# Patient Record
Sex: Female | Born: 1965 | Race: White | Hispanic: No | Marital: Married | Smoking: Current every day smoker
Health system: Southern US, Community
[De-identification: ages and names within clinical notes are randomized; demographics above are authoritative.]

## PROBLEM LIST (undated history)

## (undated) DIAGNOSIS — C801 Malignant (primary) neoplasm, unspecified: Secondary | ICD-10-CM

## (undated) HISTORY — PX: CHOLECYSTECTOMY: SHX55

---

## 2018-02-20 ENCOUNTER — Emergency Department (HOSPITAL_COMMUNITY): Payer: Commercial Managed Care - PPO

## 2018-02-20 ENCOUNTER — Other Ambulatory Visit: Payer: Self-pay

## 2018-02-20 ENCOUNTER — Emergency Department (HOSPITAL_COMMUNITY)
Admission: EM | Admit: 2018-02-20 | Discharge: 2018-02-20 | Disposition: A | Discharge status: Home / Self Care | Payer: Commercial Managed Care - PPO | Attending: Emergency Medicine | Admitting: Emergency Medicine

## 2018-02-20 ENCOUNTER — Encounter (HOSPITAL_COMMUNITY): Payer: Self-pay | Admitting: *Deleted

## 2018-02-20 DIAGNOSIS — F17200 Nicotine dependence, unspecified, uncomplicated: Secondary | ICD-10-CM | POA: Insufficient documentation

## 2018-02-20 DIAGNOSIS — R918 Other nonspecific abnormal finding of lung field: Secondary | ICD-10-CM

## 2018-02-20 DIAGNOSIS — R079 Chest pain, unspecified: Secondary | ICD-10-CM | POA: Diagnosis present

## 2018-02-20 DIAGNOSIS — Z7982 Long term (current) use of aspirin: Secondary | ICD-10-CM | POA: Diagnosis not present

## 2018-02-20 HISTORY — DX: Malignant (primary) neoplasm, unspecified: C80.1

## 2018-02-20 LAB — D-DIMER, QUANTITATIVE: D-Dimer, Quant: 1.2 ug/mL-FEU — ABNORMAL HIGH (ref 0.00–0.50)

## 2018-02-20 LAB — CBC
HEMATOCRIT: 41.7 % (ref 36.0–46.0)
Hemoglobin: 14.4 g/dL (ref 12.0–15.0)
MCH: 30.6 pg (ref 26.0–34.0)
MCHC: 34.5 g/dL (ref 30.0–36.0)
MCV: 88.7 fL (ref 78.0–100.0)
Platelets: 322 10*3/uL (ref 150–400)
RBC: 4.7 MIL/uL (ref 3.87–5.11)
RDW: 12.8 % (ref 11.5–15.5)
WBC: 8.3 10*3/uL (ref 4.0–10.5)

## 2018-02-20 LAB — BASIC METABOLIC PANEL
Anion gap: 8 (ref 5–15)
BUN: 10 mg/dL (ref 6–20)
CO2: 19 mmol/L — ABNORMAL LOW (ref 22–32)
Calcium: 8.6 mg/dL — ABNORMAL LOW (ref 8.9–10.3)
Chloride: 109 mmol/L (ref 98–111)
Creatinine, Ser: 0.64 mg/dL (ref 0.44–1.00)
GFR calc Af Amer: >60 mL/min (ref >60)
Glucose, Bld: 103 mg/dL — ABNORMAL HIGH (ref 70–99)
POTASSIUM: 3.4 mmol/L — AB (ref 3.5–5.1)
SODIUM: 136 mmol/L (ref 135–145)

## 2018-02-20 LAB — TROPONIN I

## 2018-02-20 MED ORDER — IOPAMIDOL (ISOVUE-370) INJECTION 76%
100.0000 mL | Freq: Once | INTRAVENOUS | Status: AC | PRN
  Administered 2018-02-20: 100 mL via INTRAVENOUS

## 2018-02-20 NOTE — ED Provider Notes (Signed)
[facility] EMERGENCY DEPARTMENT Provider Note   CSN: [id number] Arrival date & time: 02/20/18  1933     History   Chief Complaint Chief Complaint  Patient presents with  . Chest Pain    HPI Angel Booker is a 52 y.o. female.  Intermittent anterior chest pain for 1 week with episodes lasting anywhere from minutes to hours, described as a tightness.  She states minimal dyspnea, nausea, diaphoresis.  Risk factors include smoking (2 packs/day), low HDL.  No history of diabetes, hypertension, family history.  Symptoms are not associated with any activity.  Nothing makes symptoms better or worse.     Past Medical History:  Diagnosis Date  . Cancer ([place])    lip cancer    There are no active problems to display for this patient.   Past Surgical History:  Procedure Laterality Date  . CHOLECYSTECTOMY       OB History   None      Home Medications    Prior to Admission medications   Medication Sig Start Date End Date Taking? Authorizing Provider  acetaminophen (TYLENOL) 500 MG tablet Take 500 mg by mouth every 6 (six) hours as needed for mild pain or headache.   Yes [provider]  aspirin 325 MG tablet Take 325 mg by mouth daily.   Yes [provider]    Family History No family history on file.  Social History Social History   Tobacco Use  . Smoking status: Current Every Day Smoker  . Smokeless tobacco: Never Used  Substance Use Topics  . Alcohol use: Never    Frequency: Never  . Drug use: Never     Allergies   Codeine and Penicillins   Review of Systems Review of Systems  All other systems reviewed and are negative.    Physical Exam Updated Vital Signs BP 97/62   Pulse 71   Temp 99 F (37.2 C) (Oral) Comment: Simultaneous filing. User may not have seen previous data. Comment (Src): Simultaneous filing. User may not have seen previous data.  Resp 20   Ht 5\' 2"  (1.575 m)   Wt 68 kg   SpO2 93%   BMI 27.44 kg/m    Physical Exam  Constitutional: She is oriented to person, place, and time. She appears well-developed and well-nourished.  HENT:  Head: Normocephalic and atraumatic.  Eyes: Conjunctivae are normal.  Neck: Neck supple.  Cardiovascular: Normal rate and regular rhythm.  Pulmonary/Chest: Effort normal and breath sounds normal.  Abdominal: Soft. Bowel sounds are normal.  Musculoskeletal: Normal range of motion.  Neurological: She is alert and oriented to person, place, and time.  Skin: Skin is warm and dry.  Psychiatric: She has a normal mood and affect. Her behavior is normal.  Nursing note and vitals reviewed.    ED Treatments / Results  Labs (all labs ordered are listed, but only abnormal results are displayed) Labs Reviewed  BASIC METABOLIC PANEL - Abnormal; Notable for the following components:      Result Value   Potassium 3.4 (*)    CO2 19 (*)    Glucose, Bld 103 (*)    Calcium 8.6 (*)    All other components within normal limits  D-DIMER, QUANTITATIVE (NOT AT [facility]) - Abnormal; Notable for the following components:   D-Dimer, Quant 1.20 (*)    All other components within normal limits  CBC  TROPONIN I    EKG EKG Interpretation  Date/Time:  Sunday February 20 2018 19:42:31 EDT Ventricular  Rate:  79 PR Interval:    QRS Duration: 77 QT Interval:  389 QTC Calculation: 446 R Axis:   70 Text Interpretation:  Sinus rhythm Baseline wander in lead(s) II III aVL aVF V3 Confirmed by Nat Christen [phone]) on 02/20/2018 9:04:11 PM   Radiology Dg Chest 2 View  Result Date: 02/20/2018 CLINICAL DATA:  Acute chest pain today. EXAM: CHEST - 2 VIEW COMPARISON:  None. FINDINGS: The cardiomediastinal silhouette is unremarkable. There is no evidence of focal airspace disease, pulmonary edema, suspicious pulmonary nodule/mass, pleural effusion, or pneumothorax. No acute bony abnormalities are identified. IMPRESSION: No active cardiopulmonary disease. Electronically Signed   By: Margarette Canada M.D.   On: 02/20/2018 20:33   Ct Angio Chest Pe W And/or Wo Contrast  Result Date: 02/20/2018 CLINICAL DATA:  PE suspected, intermediate prob, positive D-dimer. Chest pain. Shortness of breath. EXAM: CT ANGIOGRAPHY CHEST WITH CONTRAST TECHNIQUE: Multidetector CT imaging of the chest was performed using the standard protocol during bolus administration of intravenous contrast. Multiplanar CT image reconstructions and MIPs were obtained to evaluate the vascular anatomy. CONTRAST:  132mL ISOVUE-370 IOPAMIDOL (ISOVUE-370) INJECTION 76% COMPARISON:  Radiograph earlier this day. FINDINGS: Cardiovascular: There are no filling defects within the pulmonary arteries to suggest pulmonary embolus. The thoracic aorta is normal in caliber without dissection. Mention of branching pattern from the aortic arch. The heart is normal in size. No pericardial effusion. Mediastinum/Nodes: No enlarged mediastinal or hilar lymph nodes. Minimal ill-defined soft tissue density in the anterior mediastinum without well-defined mass. Small to moderate hiatal hernia. Stomach as well as the hiatal hernia contains ingested contents. No visualized thyroid nodule. Lungs/Pleura: Mild apical prominent emphysema. No consolidation, pulmonary edema or pleural fluid. Tiny right upper lobe subpleural nodules images 32 and 38 series 6. Linear opacity in the anterior right upper lobe image 62 series 3 may be scarring or atelectasis, does not have a rounded or nodular appearance. Trachea mainstem bronchi are patent. Upper Abdomen: Small to moderate hiatal hernia. Stomach as well as hiatal hernia and distended with ingested contents. Post cholecystectomy. Musculoskeletal: Scattered degenerative change and Schmorl's nodes in the spine. There are no acute or suspicious osseous abnormalities. Review of the MIP images confirms the above findings. IMPRESSION: 1. No pulmonary embolus or acute intrathoracic abnormality. 2. Mild emphysema. 3. Tiny right upper  lobe subpleural nodules are nonspecific, there is an additional linear opacity in the anterior right upper lobe that is likely scarring or atelectasis, but developing pulmonary nodule is also considered in the setting of emphysema. Non-contrast chest CT can be considered in 12 months if patient is high-risk. This recommendation follows the consensus statement: Guidelines for Management of Incidental Pulmonary Nodules Detected on CT Images: From the Fleischner Society 2017; Radiology 2017; 284:228-243. 4. Small to moderate hiatal hernia. There is ingested material within the stomach as well as hiatal hernia, which can be seen with reflux. Emphysema (ICD10-J43.9). Electronically Signed   By: Keith Rake M.D.   On: 02/20/2018 21:48    Procedures Procedures (including critical care time)  Medications Ordered in ED Medications  iopamidol (ISOVUE-370) 76 % injection 100 mL (100 mLs Intravenous Contrast Given 02/20/18 2049)     Initial Impression / Assessment and Plan / ED Course  I have reviewed the triage vital signs and the nursing notes.  Pertinent labs & imaging results that were available during my care of the patient were reviewed by me and considered in my medical decision making (see chart for details).  Presents with intermittent chest pain for 1 week.  EKG, troponin, screening labs all negative.  D-dimer slightly elevated.  CT angiogram reveals tiny right upper lobe subpleural nodules which are nonspecific.  There is an additional linear opacity in the anterior right upper lobe but this is likely scaring or atelectasis.  Will recommend a noncontrast CT in 12 months.  This was discussed with the patient and her husband.  We will follow-up with cardiology also.  Final Clinical Impressions(s) / ED Diagnoses   Final diagnoses:  Chest pain, unspecified type  Pulmonary nodules    ED Discharge Orders    None       Nat Christen, MD 02/20/18 2155

## 2018-02-20 NOTE — ED Triage Notes (Signed)
Pt c/o mid sternal chest pain that radiates to the back area that started a week ago becoming worse last night with sob, n/v,

## 2018-02-20 NOTE — Discharge Instructions (Addendum)
Tests showed no evidence of a heart attack.  You do have some nodules in your right upper lung.  Recommend CT scan in 12 months.  Suggest follow-up with cardiologist.  Phone number given.  Stop smoking.

## 2019-09-11 IMAGING — CT CT ANGIO CHEST
2 of 6 series · 17 of 46 positions shown · IV contrast (Isovue)
Comparison: Radiograph earlier this day.

CLINICAL DATA: PE suspected, intermediate prob, positive D-dimer.
Chest pain. Shortness of breath.

EXAM:
CT ANGIOGRAPHY CHEST WITH CONTRAST
TECHNIQUE: Multidetector CT imaging of the chest was performed using the
standard protocol during bolus administration of intravenous
contrast. Multiplanar CT image reconstructions and MIPs were
obtained to evaluate the vascular anatomy.
CONTRAST:  100mL XC70A5-3CK IOPAMIDOL (XC70A5-3CK) INJECTION 76%

[Series 5: thins · axial · 0.64mm/px · z∈[+1473,+1730]mm · 14 of 283 slices shown]
[im 13/283  lung]
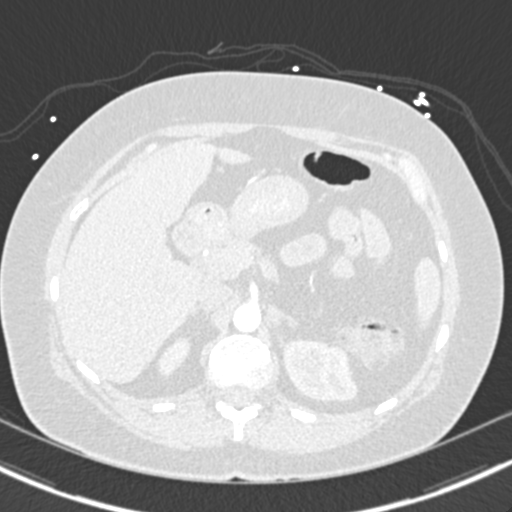
[im 37/283  soft-tissue]
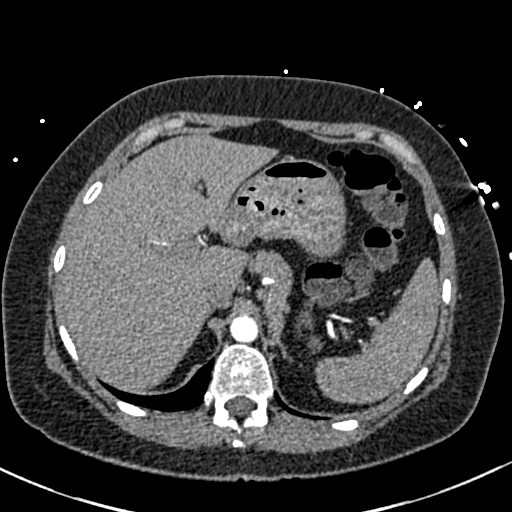
[im 50/283  lung]
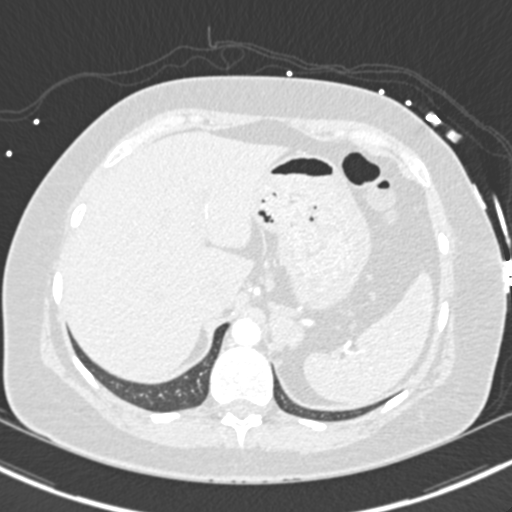
[im 74/283  soft-tissue]
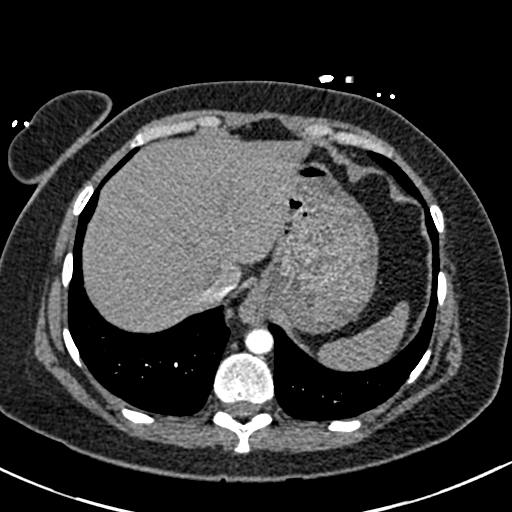
[im 99/283  lung]
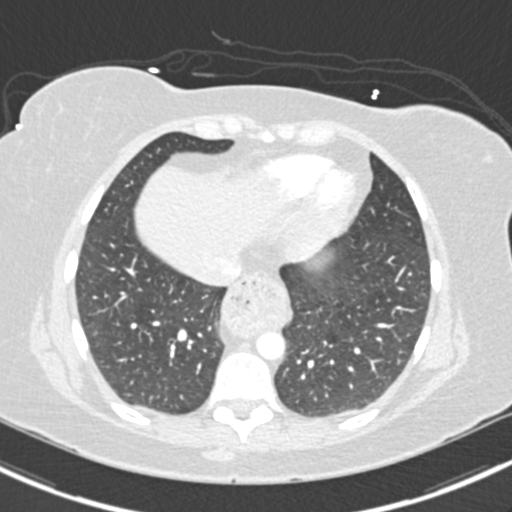
[im 111/283  soft-tissue]
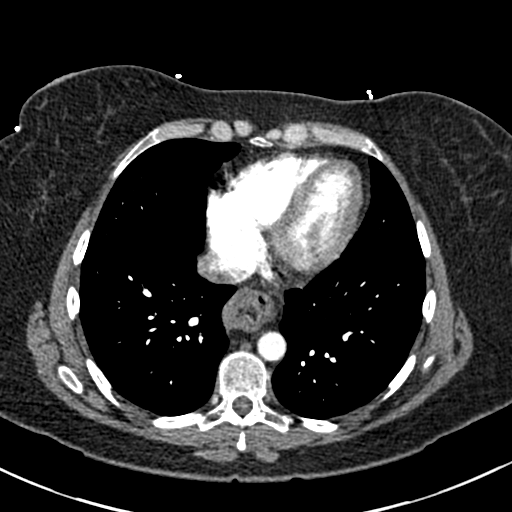
[im 135/283  lung]
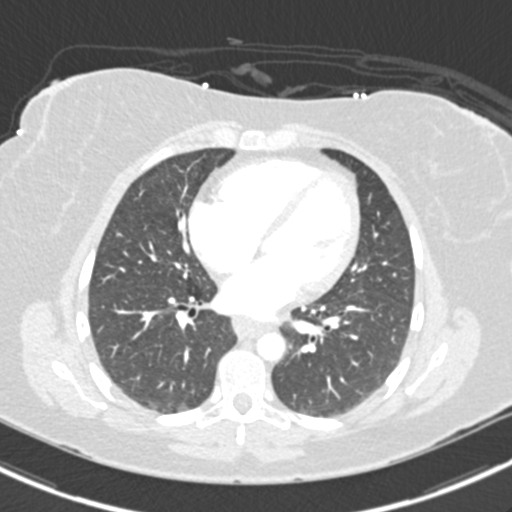
[im 148/283  soft-tissue]
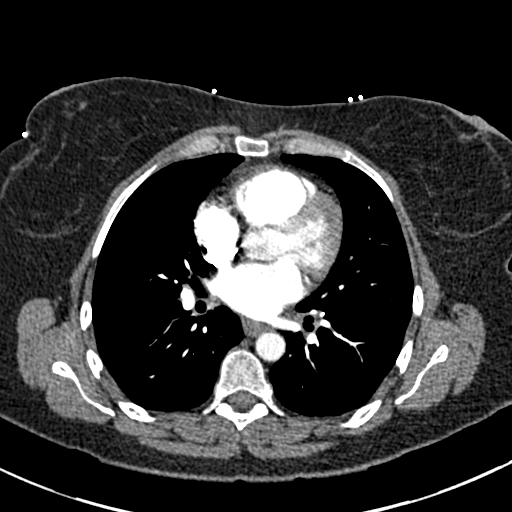
[im 172/283  lung]
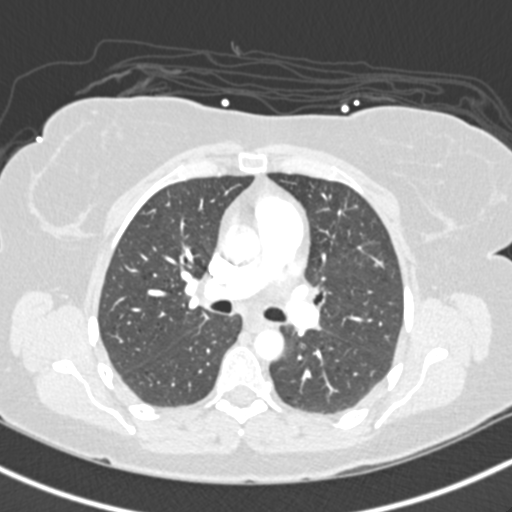
[im 184/283  soft-tissue]
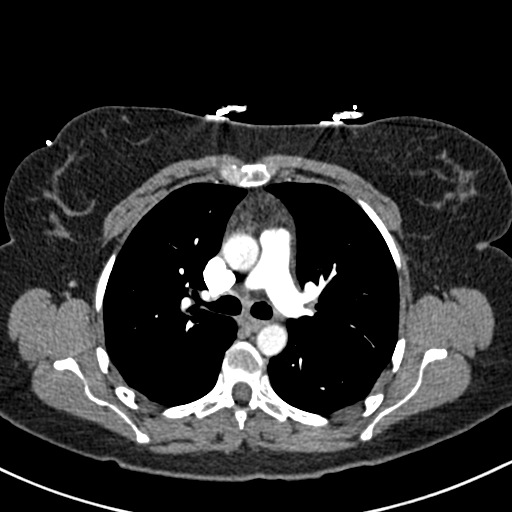
[im 209/283  lung]
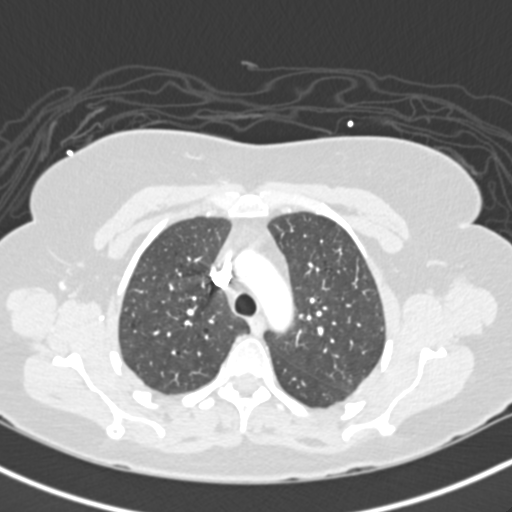
[im 233/283  soft-tissue]
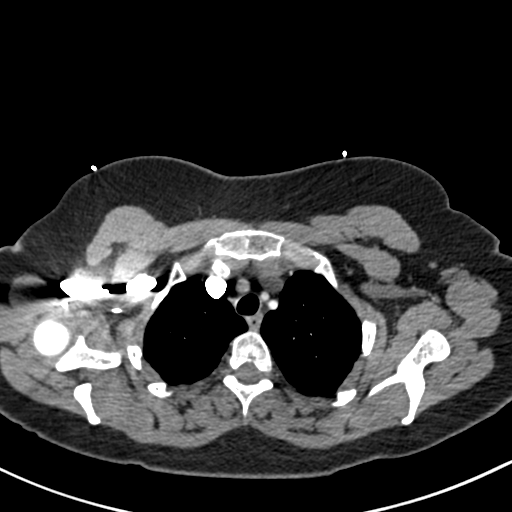
[im 246/283  lung]
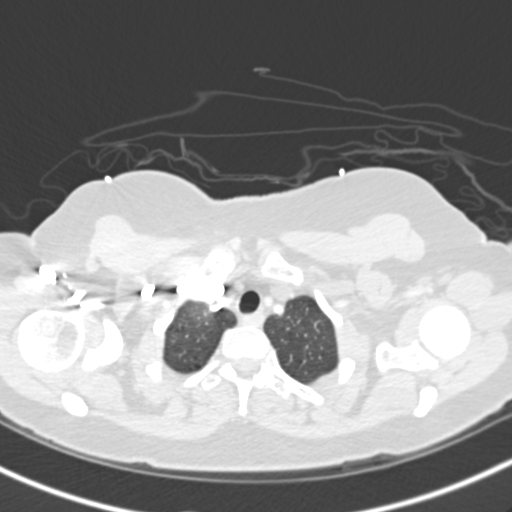
[im 270/283  soft-tissue]
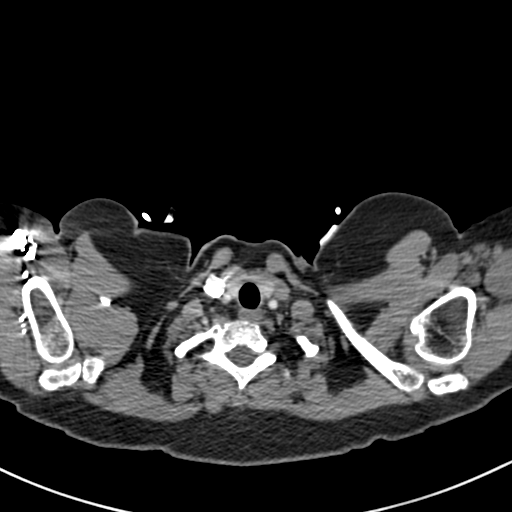

[Series 7: coronal mpr · coronal · 0.59mm/px · 3 of 150 slices shown]
[im 38/150  soft-tissue]
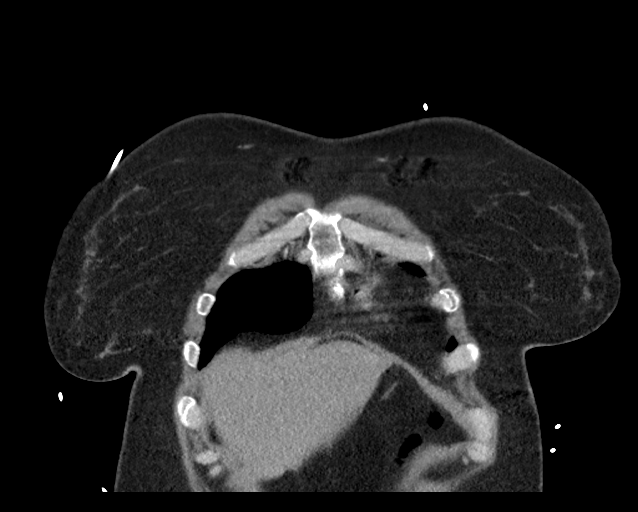
[im 75/150  soft-tissue]
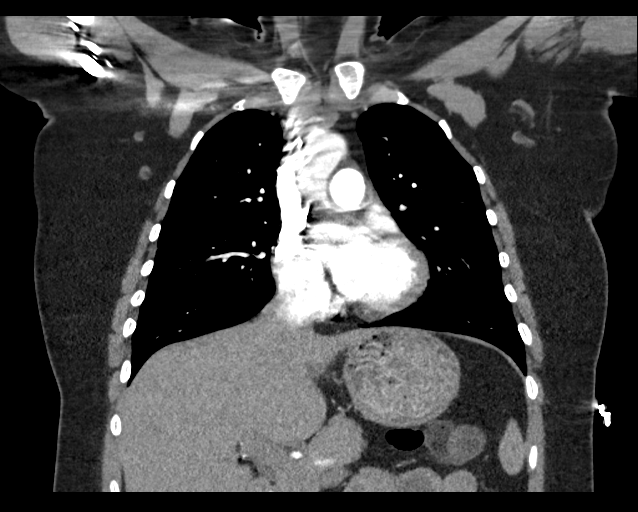
[im 112/150  soft-tissue]
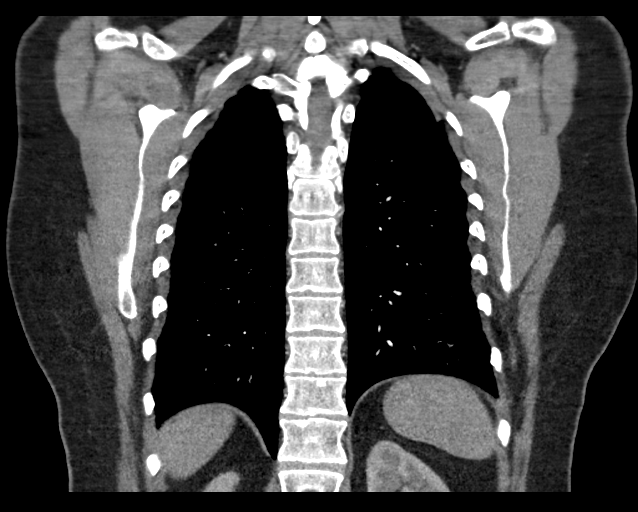

[17 of 46 positions shown; findings below may reference images not displayed]

FINDINGS: Cardiovascular: There are no filling defects within the pulmonary
arteries to suggest pulmonary embolus. The thoracic aorta is normal
in caliber without dissection. Mention of branching pattern from the
aortic arch. The heart is normal in size. No pericardial effusion.

Mediastinum/Nodes: No enlarged mediastinal or hilar lymph nodes.
Minimal ill-defined soft tissue density in the anterior mediastinum
without well-defined mass. Small to moderate hiatal hernia. Stomach
as well as the hiatal hernia contains ingested contents. No
visualized thyroid nodule.

Lungs/Pleura: Mild apical prominent emphysema. No consolidation,
pulmonary edema or pleural fluid. Tiny right upper lobe subpleural
nodules images 32 and 38 series 6. Linear opacity in the anterior
right upper lobe image 62 series [DATE] be scarring or atelectasis,
does not have a rounded or nodular appearance. Trachea mainstem
bronchi are patent.

Upper Abdomen: Small to moderate hiatal hernia. Stomach as well as
hiatal hernia and distended with ingested contents. Post
cholecystectomy.

Musculoskeletal: Scattered degenerative change and Schmorl's nodes
in the spine. There are no acute or suspicious osseous
abnormalities.

Review of the MIP images confirms the above findings.
IMPRESSION: 1. No pulmonary embolus or acute intrathoracic abnormality.
2. Mild emphysema.
3. Tiny right upper lobe subpleural nodules are nonspecific, there
is an additional linear opacity in the anterior right upper lobe
that is likely scarring or atelectasis, but developing pulmonary
nodule is also considered in the setting of emphysema. Non-contrast
chest CT can be considered in 12 months if patient is high-risk.
This recommendation follows the consensus statement: Guidelines for
Management of Incidental Pulmonary Nodules Detected on CT Images:
4. Small to moderate hiatal hernia. There is ingested material
within the stomach as well as hiatal hernia, which can be seen with
reflux.

Emphysema ([id number]).

## 2019-09-11 IMAGING — DX DG CHEST 2V
2 series · 2 of 2 positions shown · non-contrast
Comparison: None.

CLINICAL DATA: Acute chest pain today.

EXAM:
CHEST - 2 VIEW

[chest pa]
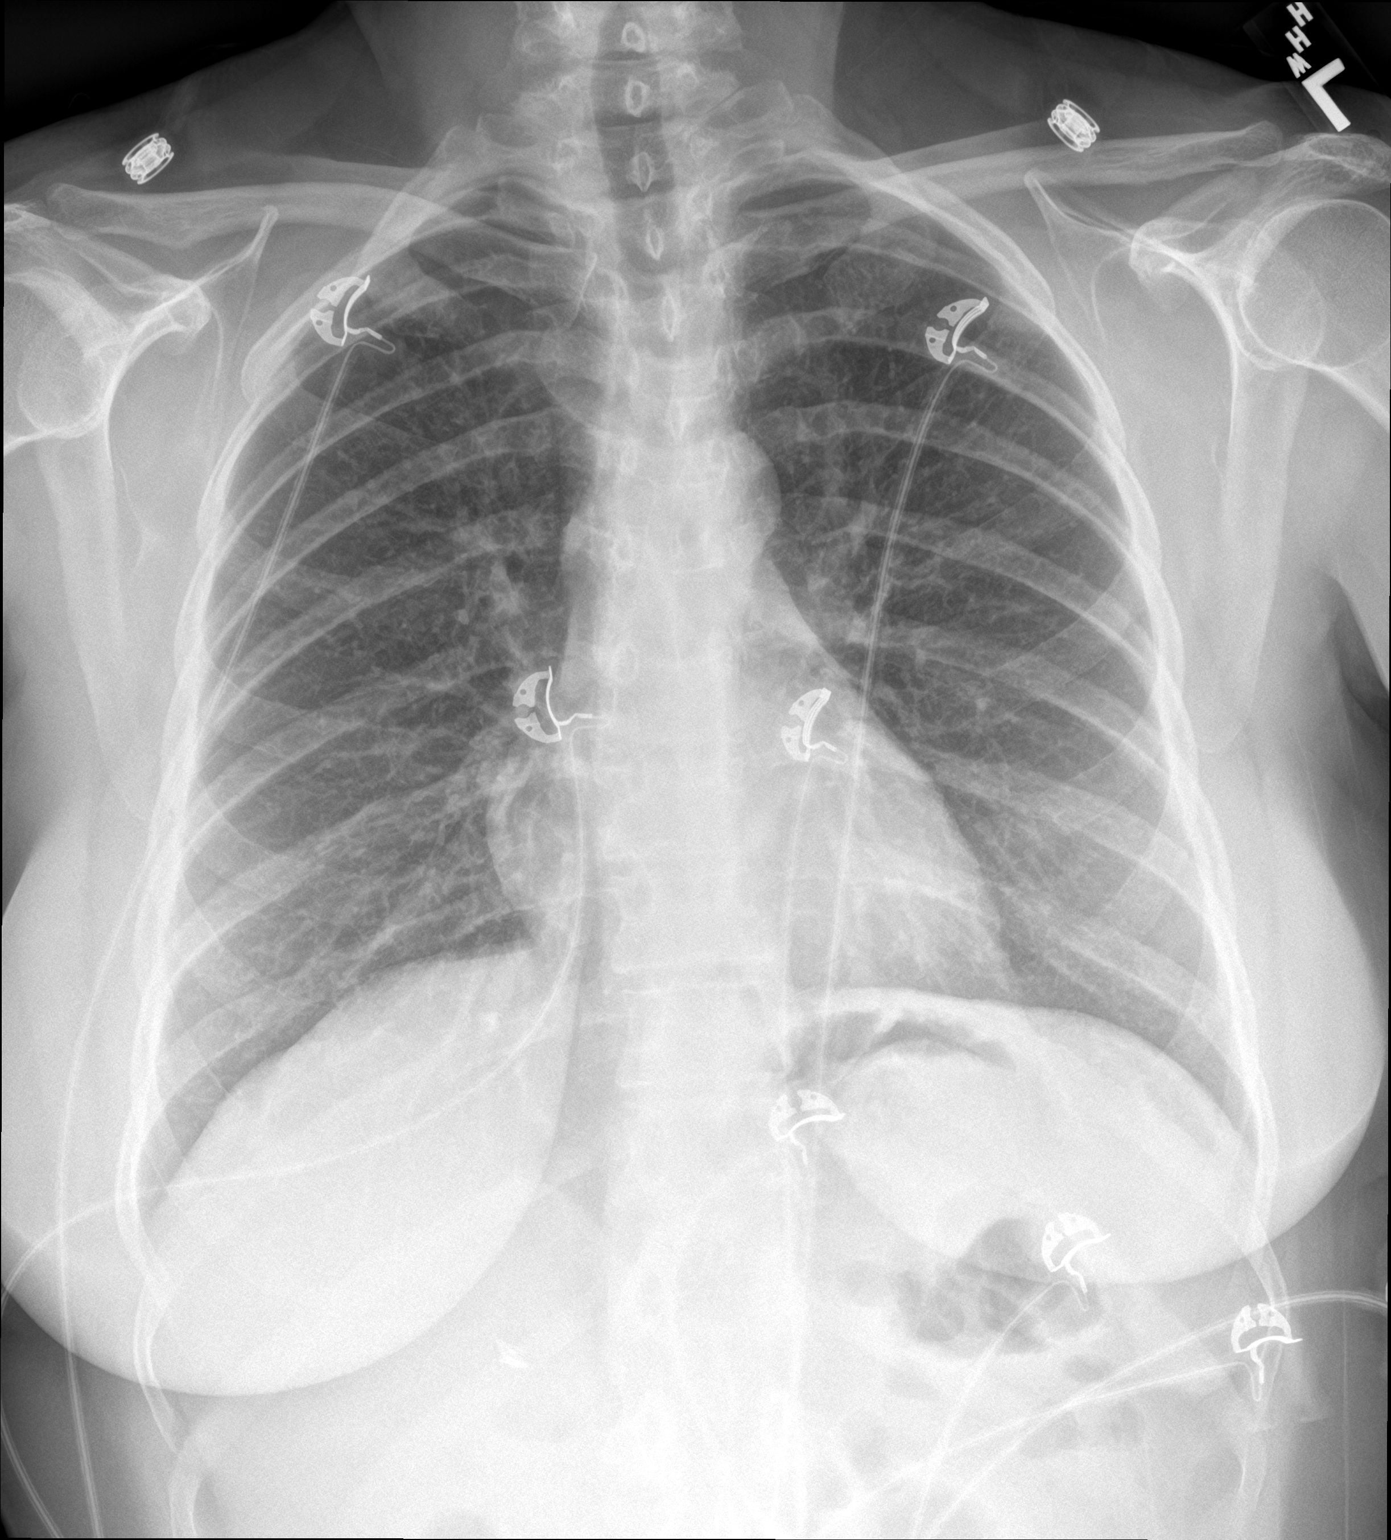

[chest lat]
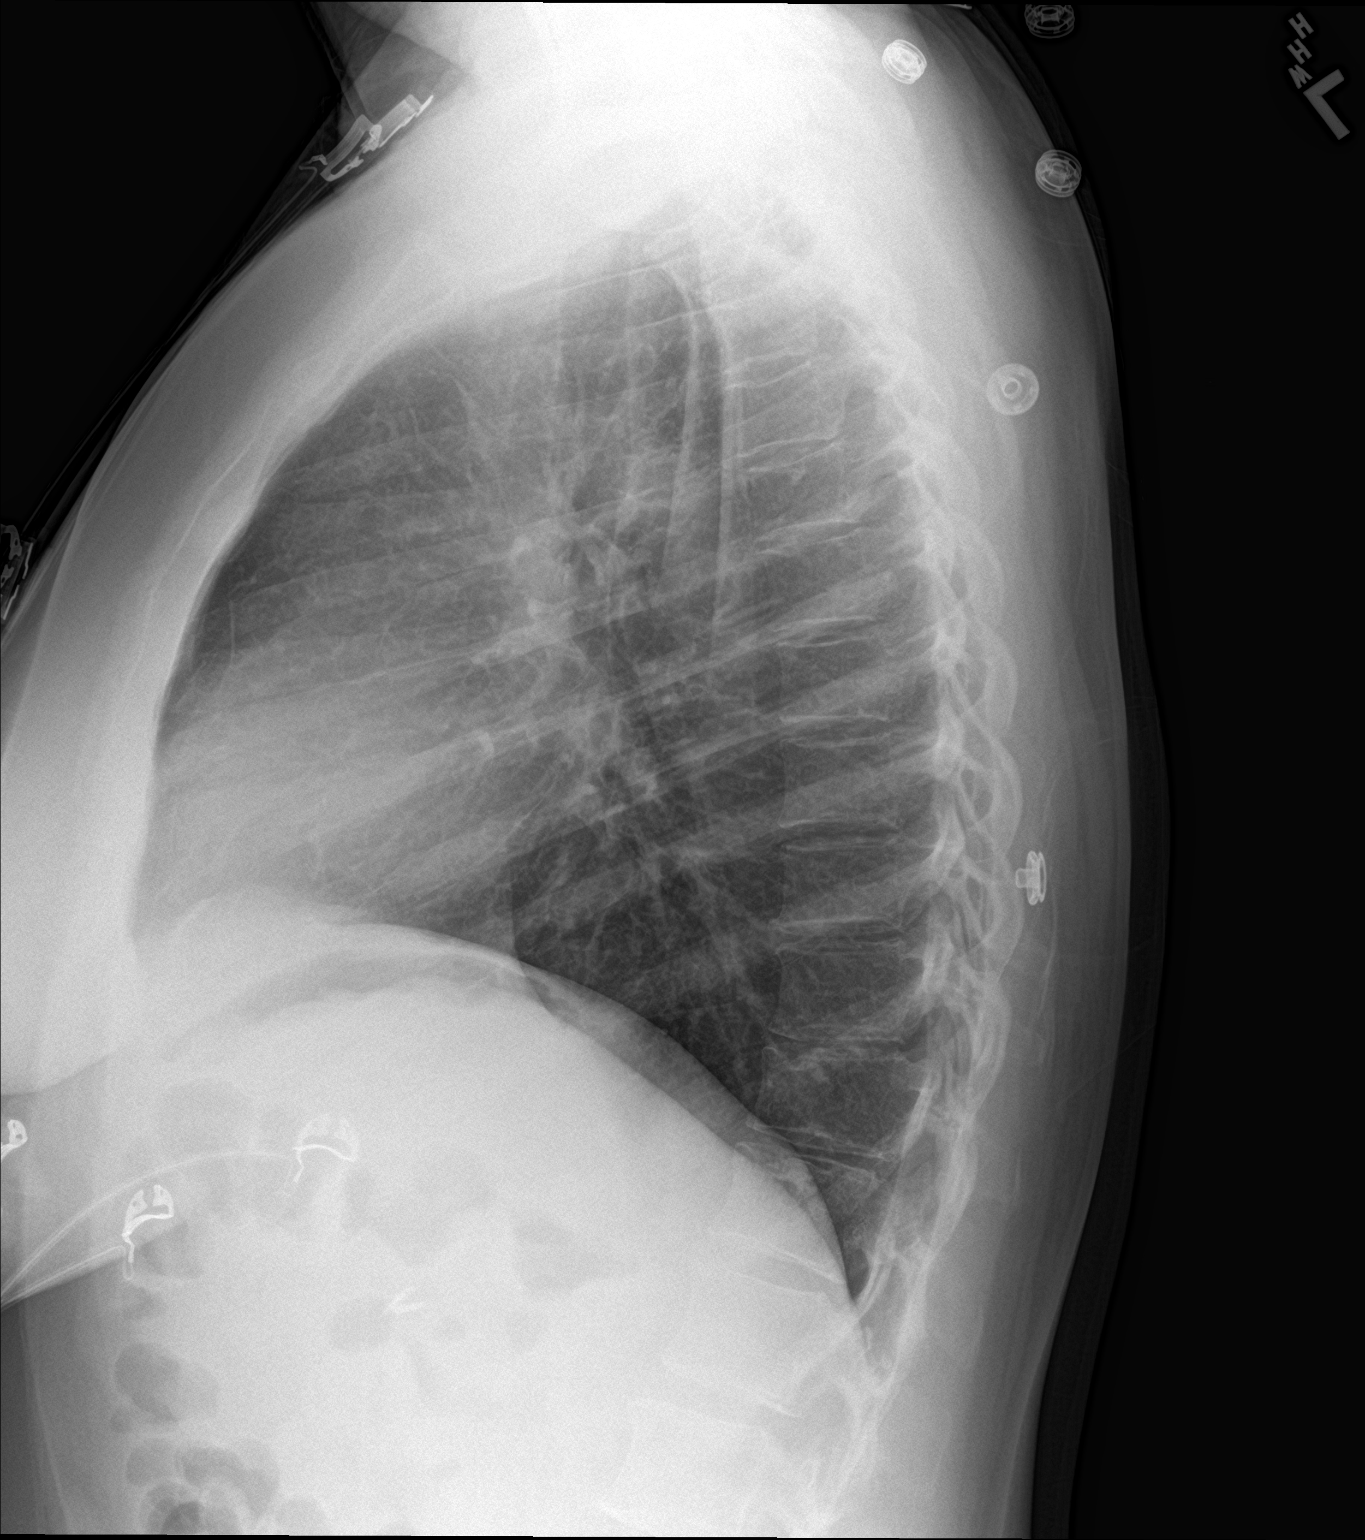

[2 of 2 positions shown; findings below may reference images not displayed]

FINDINGS: The cardiomediastinal silhouette is unremarkable.

There is no evidence of focal airspace disease, pulmonary edema,
suspicious pulmonary nodule/mass, pleural effusion, or pneumothorax.

No acute bony abnormalities are identified.
IMPRESSION: No active cardiopulmonary disease.
